# Patient Record
Sex: Female | Born: 1975 | Race: White | Hispanic: No | Marital: Single | Smoking: Former smoker
Health system: Southern US, Community
[De-identification: ages and names within clinical notes are randomized; demographics above are authoritative.]

## PROBLEM LIST (undated history)

## (undated) DIAGNOSIS — E079 Disorder of thyroid, unspecified: Secondary | ICD-10-CM

## (undated) DIAGNOSIS — F419 Anxiety disorder, unspecified: Secondary | ICD-10-CM

## (undated) DIAGNOSIS — R7303 Prediabetes: Secondary | ICD-10-CM

## (undated) DIAGNOSIS — F32A Depression, unspecified: Secondary | ICD-10-CM

## (undated) DIAGNOSIS — F329 Major depressive disorder, single episode, unspecified: Secondary | ICD-10-CM

## (undated) DIAGNOSIS — I10 Essential (primary) hypertension: Secondary | ICD-10-CM

---
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
## 2008-03-01 ENCOUNTER — Ambulatory Visit: Payer: Self-pay | Admitting: Family Medicine

## 2008-09-22 ENCOUNTER — Ambulatory Visit: Payer: Self-pay | Admitting: Internal Medicine

## 2008-11-20 ENCOUNTER — Ambulatory Visit: Payer: Self-pay | Admitting: Internal Medicine

## 2012-11-14 ENCOUNTER — Ambulatory Visit: Payer: Self-pay | Admitting: Family Medicine

## 2012-11-14 LAB — URINALYSIS, COMPLETE
Bilirubin,UR: NEGATIVE
Nitrite: NEGATIVE
Ph: 6 (ref 4.5–8.0)
Protein: NEGATIVE
RBC,UR: >30 /HPF (ref 0–5)
Specific Gravity: 1.02 (ref 1.003–1.030)

## 2012-11-16 LAB — URINE CULTURE

## 2016-11-08 ENCOUNTER — Other Ambulatory Visit: Payer: Self-pay | Admitting: Family Medicine

## 2016-11-08 DIAGNOSIS — Z1231 Encounter for screening mammogram for malignant neoplasm of breast: Secondary | ICD-10-CM

## 2016-12-08 ENCOUNTER — Encounter: Payer: Self-pay | Admitting: Radiology

## 2016-12-08 ENCOUNTER — Ambulatory Visit
Admission: RE | Admit: 2016-12-08 | Discharge: 2016-12-08 | Disposition: A | Discharge status: Home / Self Care | Payer: BLUE CROSS/BLUE SHIELD | Source: Ambulatory Visit | Attending: Family Medicine | Admitting: Family Medicine

## 2016-12-08 DIAGNOSIS — Z1231 Encounter for screening mammogram for malignant neoplasm of breast: Secondary | ICD-10-CM | POA: Diagnosis present

## 2016-12-15 ENCOUNTER — Other Ambulatory Visit: Payer: Self-pay | Admitting: Family Medicine

## 2016-12-15 DIAGNOSIS — N6310 Unspecified lump in the right breast, unspecified quadrant: Secondary | ICD-10-CM

## 2016-12-15 DIAGNOSIS — N6320 Unspecified lump in the left breast, unspecified quadrant: Secondary | ICD-10-CM

## 2016-12-15 DIAGNOSIS — R928 Other abnormal and inconclusive findings on diagnostic imaging of breast: Secondary | ICD-10-CM

## 2016-12-21 ENCOUNTER — Ambulatory Visit
Admission: RE | Admit: 2016-12-21 | Discharge: 2016-12-21 | Disposition: A | Discharge status: Home / Self Care | Payer: BLUE CROSS/BLUE SHIELD | Source: Ambulatory Visit | Attending: Family Medicine | Admitting: Family Medicine

## 2016-12-21 DIAGNOSIS — N6321 Unspecified lump in the left breast, upper outer quadrant: Secondary | ICD-10-CM | POA: Insufficient documentation

## 2016-12-21 DIAGNOSIS — R928 Other abnormal and inconclusive findings on diagnostic imaging of breast: Secondary | ICD-10-CM

## 2016-12-21 DIAGNOSIS — R590 Localized enlarged lymph nodes: Secondary | ICD-10-CM | POA: Insufficient documentation

## 2016-12-21 DIAGNOSIS — N63 Unspecified lump in unspecified breast: Secondary | ICD-10-CM | POA: Diagnosis present

## 2016-12-21 DIAGNOSIS — N6320 Unspecified lump in the left breast, unspecified quadrant: Secondary | ICD-10-CM

## 2016-12-21 DIAGNOSIS — N6313 Unspecified lump in the right breast, lower outer quadrant: Secondary | ICD-10-CM | POA: Diagnosis not present

## 2016-12-21 DIAGNOSIS — N6310 Unspecified lump in the right breast, unspecified quadrant: Secondary | ICD-10-CM

## 2018-03-05 ENCOUNTER — Ambulatory Visit (INDEPENDENT_AMBULATORY_CARE_PROVIDER_SITE_OTHER): Payer: BLUE CROSS/BLUE SHIELD

## 2018-03-05 ENCOUNTER — Ambulatory Visit
Admission: EM | Admit: 2018-03-05 | Discharge: 2018-03-05 | Disposition: A | Discharge status: Home / Self Care | Payer: BLUE CROSS/BLUE SHIELD | Attending: Family Medicine | Admitting: Family Medicine

## 2018-03-05 ENCOUNTER — Other Ambulatory Visit: Payer: Self-pay

## 2018-03-05 ENCOUNTER — Encounter: Payer: Self-pay | Admitting: Emergency Medicine

## 2018-03-05 DIAGNOSIS — S46819A Strain of other muscles, fascia and tendons at shoulder and upper arm level, unspecified arm, initial encounter: Secondary | ICD-10-CM

## 2018-03-05 DIAGNOSIS — M25512 Pain in left shoulder: Secondary | ICD-10-CM

## 2018-03-05 DIAGNOSIS — S39012A Strain of muscle, fascia and tendon of lower back, initial encounter: Secondary | ICD-10-CM

## 2018-03-05 HISTORY — DX: Essential (primary) hypertension: I10

## 2018-03-05 HISTORY — DX: Disorder of thyroid, unspecified: E07.9

## 2018-03-05 MED ORDER — METHOCARBAMOL 750 MG PO TABS: 750.0000 mg | ORAL_TABLET | Freq: Three times a day (TID) | ORAL | 0 refills | Status: DC

## 2018-03-05 MED ORDER — KETOROLAC TROMETHAMINE 60 MG/2ML IM SOLN
60.0000 mg | Freq: Once | INTRAMUSCULAR | Status: AC
  Administered 2018-03-05: 60 mg via INTRAMUSCULAR

## 2018-03-05 MED ORDER — MELOXICAM 15 MG PO TABS: 15.0000 mg | ORAL_TABLET | Freq: Every day | ORAL | 0 refills | Status: DC | PRN
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
## 2018-03-05 NOTE — Discharge Instructions (Signed)
Take medication as prescribed. Rest. Drink plenty of fluids. Stretch.  ° °Follow up with your primary care physician this week as needed. Return to Urgent care for new or worsening concerns.  ° °
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
## 2019-06-08 ENCOUNTER — Ambulatory Visit
Admission: EM | Admit: 2019-06-08 | Discharge: 2019-06-08 | Disposition: A | Discharge status: Home / Self Care | Payer: BC Managed Care – PPO | Attending: Family Medicine | Admitting: Family Medicine

## 2019-06-08 DIAGNOSIS — T782XXA Anaphylactic shock, unspecified, initial encounter: Secondary | ICD-10-CM

## 2019-06-08 DIAGNOSIS — W57XXXA Bitten or stung by nonvenomous insect and other nonvenomous arthropods, initial encounter: Secondary | ICD-10-CM

## 2019-06-08 DIAGNOSIS — R21 Rash and other nonspecific skin eruption: Secondary | ICD-10-CM

## 2019-06-08 HISTORY — DX: Depression, unspecified: F32.A

## 2019-06-08 HISTORY — DX: Anxiety disorder, unspecified: F41.9

## 2019-06-08 MED ORDER — DIPHENHYDRAMINE HCL 50 MG PO CAPS
50.0000 mg | ORAL_CAPSULE | Freq: Once | ORAL | Status: AC
Start: 2019-06-08 — End: 2019-06-08
  Administered 2019-06-08: 50 mg via ORAL

## 2019-06-08 MED ORDER — EPINEPHRINE 0.3 MG/0.3ML IJ SOAJ: 0.3000 mg | INTRAMUSCULAR | 1 refills | Status: AC | PRN

## 2019-06-08 MED ORDER — EPINEPHRINE PF 1 MG/ML IJ SOLN
0.3000 mg | Freq: Once | INTRAMUSCULAR | Status: AC
  Administered 2019-06-08: 16:00:00 0.3 mg via SUBCUTANEOUS

## 2019-06-08 MED ORDER — PREDNISONE 10 MG PO TABS: ORAL_TABLET | ORAL | 0 refills | Status: DC

## 2019-06-08 MED ORDER — METHYLPREDNISOLONE SODIUM SUCC 125 MG IJ SOLR
125.0000 mg | Freq: Once | INTRAMUSCULAR | Status: AC
  Administered 2019-06-08: 16:00:00 125 mg via INTRAMUSCULAR

## 2019-06-08 MED ORDER — FAMOTIDINE 20 MG PO TABS
20.0000 mg | ORAL_TABLET | Freq: Once | ORAL | Status: AC
  Administered 2019-06-08: 16:00:00 20 mg via ORAL

## 2019-06-08 NOTE — ED Provider Notes (Signed)
[facility]    CSN: [id number] Arrival date & time: 06/08/19  1511     History   Chief Complaint Chief Complaint  Patient presents with  . Allergic Reaction    HPI Wendy Sherman is a 43 y.o. female.   43 yo female with a c/o rash/hives, itching and lip swelling since about 2 hours ago after getting stung by a wasp on her right knee.  States she did not have a prior h/o bee allergy but that their is a family history.  Denies any chest pain, shortness of breath, wheezing, tongue or throat swelling.    Allergic Reaction   Past Medical History:  Diagnosis Date  . Anxiety   . Depression   . Hypertension   . Thyroid disease     There are no active problems to display for this patient.   History reviewed. No pertinent surgical history.  OB History   No obstetric history on file.      Home Medications    Prior to Admission medications   Medication Sig Start Date End Date Taking? Authorizing Provider  levothyroxine (SYNTHROID) 100 MCG tablet Take by mouth. 03/11/19  Yes [provider]  lisinopril (ZESTRIL) 10 MG tablet Take by mouth. 03/11/19  Yes [provider]  Norgestimate-Ethinyl Estradiol Triphasic 0.18/0.215/0.25 MG-35 MCG tablet Take by mouth. 03/19/19  Yes [provider]  phentermine (ADIPEX-P) 37.5 MG tablet TAKE 1 TABLET BY MOUTH IN THE MORNING BEFORE BREAKFAST FOR 30 DAYS 05/31/19  Yes [provider]  sertraline (ZOLOFT) 25 MG tablet Take 25 mg by mouth daily. 04/21/19  Yes [provider]  citalopram (CELEXA) 10 MG tablet Take by mouth. 09/25/17   [provider]  EPINEPHrine 0.3 mg/0.3 mL IJ SOAJ injection Inject 0.3 mLs (0.3 mg total) into the muscle as needed for anaphylaxis. 06/08/19   Payton Mccallumonty, Wendolyn Raso, MD  levothyroxine (SYNTHROID, LEVOTHROID) 100 MCG tablet Take by mouth. 03/21/17 03/21/18  [provider]  lisinopril (PRINIVIL,ZESTRIL) 10 MG tablet Take by mouth. 02/26/18 02/26/19   [provider]  meloxicam (MOBIC) 15 MG tablet Take 1 tablet (15 mg total) by mouth daily as needed. 03/05/18   Renford DillsMiller, Lindsey, NP  methocarbamol (ROBAXIN-750) 750 MG tablet Take 1-2 tablets (750-1,500 mg total) by mouth 3 (three) times daily. Do not drive or operate machinery as can cause drowsiness. 03/05/18   Renford DillsMiller, Lindsey, NP  Norgestimate-Ethinyl Estradiol Triphasic 0.18/0.215/0.25 MG-35 MCG tablet Take by mouth. 01/31/18   [provider]  phentermine 15 MG capsule Take by mouth. 02/26/18 03/28/18  [provider]  predniSONE (DELTASONE) 10 MG tablet Start 60 mg po day one, then 50 mg po day two, taper by 10 mg daily until complete. 06/08/19   Payton Mccallumonty, Primitivo Merkey, MD  topiramate (TOPAMAX) 100 MG tablet Take by mouth. 02/26/18 02/26/19  [provider]    Family History Family History  Problem Relation Age of Onset  . Breast cancer Paternal Grandmother 2260  . Hypertension Father     Social History Social History   Tobacco Use  . Smoking status: Former Games developermoker  . Smokeless tobacco: Never Used  Substance Use Topics  . Alcohol use: Yes  . Drug use: Not on file     Allergies   Patient has no known allergies.   Review of Systems Review of Systems   Physical Exam Triage Vital Signs ED Triage Vitals  Enc Vitals Group     BP 06/08/19 1521 117/81     Pulse Rate  06/08/19 1521 (!) 102     Resp 06/08/19 1521 18     Temp 06/08/19 1521 98 F (36.7 C)     Temp Source 06/08/19 1521 Oral     SpO2 06/08/19 1521 100 %     Weight 06/08/19 1519 210 lb (95.3 kg)     Height 06/08/19 1519 5\' 7"  (1.702 m)     Head Circumference --      Peak Flow --      Pain Score 06/08/19 1519 0     Pain Loc --      Pain Edu? --      Excl. in GC? --    No data found.  Updated Vital Signs BP 117/81 (BP Location: Left Arm)   Pulse (!) 102   Temp 98 F (36.7 C) (Oral)   Resp 18   Ht 5\' 7"  (1.702 m)   Wt 95.3 kg   LMP 05/29/2019 (Exact Date)   SpO2 100%   BMI 32.89  kg/m   Visual Acuity Right Eye Distance:   Left Eye Distance:   Bilateral Distance:    Right Eye Near:   Left Eye Near:    Bilateral Near:     Physical Exam Vitals signs and nursing note reviewed.  Constitutional:      General: She is not in acute distress.    Appearance: She is not toxic-appearing or diaphoretic.  HENT:     Nose: No congestion or rhinorrhea.     Mouth/Throat:     Pharynx: No pharyngeal swelling, oropharyngeal exudate, posterior oropharyngeal erythema or uvula swelling.     Comments: Lip swelling noted upper and lower Cardiovascular:     Rate and Rhythm: Regular rhythm. Tachycardia present.     Heart sounds: Normal heart sounds.  Pulmonary:     Effort: Pulmonary effort is normal. No respiratory distress.     Breath sounds: Normal breath sounds. No stridor. No wheezing, rhonchi or rales.  Skin:    Findings: Rash present. Rash is urticarial (diffuse on face, trunk, arms).  Neurological:     Mental Status: She is alert.      UC Treatments / Results  Labs (all labs ordered are listed, but only abnormal results are displayed) Labs Reviewed - No data to display  EKG   Radiology No results found.  Procedures Procedures (including critical care time)  Medications Ordered in UC Medications  diphenhydrAMINE (BENADRYL) capsule 50 mg (50 mg Oral Given 06/08/19 1535)  methylPREDNISolone sodium succinate (SOLU-MEDROL) 125 mg/2 mL injection 125 mg (125 mg Intramuscular Given 06/08/19 1542)  famotidine (PEPCID) tablet 20 mg (20 mg Oral Given 06/08/19 1556)  EPINEPHrine (ADRENALIN) 0.3 mg (0.3 mg Subcutaneous Given 06/08/19 1557)    Initial Impression / Assessment and Plan / UC Course  I have reviewed the triage vital signs and the nursing notes.  Pertinent labs & imaging results that were available during my care of the patient were reviewed by me and considered in my medical decision making (see chart for details).      Final Clinical Impressions(s) /  UC Diagnoses   Final diagnoses:  Anaphylaxis, initial encounter  (resolved)   Discharge Instructions     Benadryl as needed Go to Emergency Department if symptoms return    ED Prescriptions    Medication Sig Dispense Auth. Provider   predniSONE (DELTASONE) 10 MG tablet Start 60 mg po day one, then 50 mg po day two, taper by 10 mg daily until complete. 21 tablet Simrin Vegh,  Gordy Goar, MD   EPINEPHrine 0.3 mg/0.3 mL IJ SOAJ injection Inject 0.3 mLs (0.3 mg total) into the muscle as needed for anaphylaxis. 1 each Norval Gable, MD      1. diagnosis reviewed with patient 2. Patient given epinephrine 0.3mg  SQ, benadryl 50mg  po x1 and pepcid 20mg  po x 1; patient tolerated well and was monitored with resolution of symptoms 2. rx as per orders above; reviewed possible side effects, interactions, risks and benefits  3. Recommend supportive treatment as above 4. Follow-up prn if symptoms worsen or don't improve   Controlled Substance Prescriptions [place] Controlled Substance Registry consulted? Not Applicable   Norval Gable, MD 06/08/19 [phone]

## 2019-06-08 NOTE — ED Triage Notes (Signed)
Pt was stung in her right knee by a wasp at 1pm and since then has been having hives all over her body, itching, redness and swelling and having tightness in her chest but unsure if it's anxiety. Didn't think she was allergic to wasps before.

## 2019-06-08 NOTE — Discharge Instructions (Signed)
Benadryl as needed Go to Emergency Department if symptoms return
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
## 2021-10-18 ENCOUNTER — Ambulatory Visit
Admission: EM | Admit: 2021-10-18 | Discharge: 2021-10-18 | Disposition: A | Discharge status: Home / Self Care | Payer: BC Managed Care – PPO | Attending: Emergency Medicine | Admitting: Emergency Medicine

## 2021-10-18 DIAGNOSIS — M10071 Idiopathic gout, right ankle and foot: Secondary | ICD-10-CM | POA: Diagnosis not present

## 2021-10-18 HISTORY — DX: Prediabetes: R73.03

## 2021-10-18 MED ORDER — PREDNISONE 10 MG PO TABS: ORAL_TABLET | ORAL | 0 refills | Status: AC

## 2021-10-18 MED ORDER — KETOROLAC TROMETHAMINE 60 MG/2ML IM SOLN
30.0000 mg | Freq: Once | INTRAMUSCULAR | Status: AC
  Administered 2021-10-18: 19:00:00 30 mg via INTRAMUSCULAR

## 2021-10-18 NOTE — ED Provider Notes (Signed)
[facility]    CSN: [id number] Arrival date & time: 10/18/21  1759      History   Chief Complaint Chief Complaint  Patient presents with   Gout "Flareup"    HPI Wendy Sherman is a 45 y.o. female.   Patient presents with pain and swelling of the posterior right ankle for 1 day.  Endorses that pain started abruptly upon awakening.  Painful to bear weight.  Limited range of motion due to pain.  Denies numbness, tingling, or trauma, prior injury.  Has attempted use of H which was not helpful.  History of anxiety, hypertension, prediabetes and thyroid disease.    Past Medical History:  Diagnosis Date   Anxiety    Depression    Hypertension    Pre-diabetes    Thyroid disease     There are no problems to display for this patient.   History reviewed. No pertinent surgical history.  OB History   No obstetric history on file.      Home Medications    Prior to Admission medications   Medication Sig Start Date End Date Taking? Authorizing Provider  levothyroxine (SYNTHROID) 100 MCG tablet Take by mouth. 03/11/19  Yes [provider]  lisinopril (ZESTRIL) 10 MG tablet Take by mouth. 03/11/19  Yes [provider]  Norgestimate-Ethinyl Estradiol Triphasic 0.18/0.215/0.25 MG-35 MCG tablet Take by mouth. 01/31/18  Yes [provider]  sertraline (ZOLOFT) 25 MG tablet Take 25 mg by mouth daily. 04/21/19  Yes [provider]  citalopram (CELEXA) 10 MG tablet Take by mouth. 09/25/17   [provider]  EPINEPHrine 0.3 mg/0.3 mL IJ SOAJ injection Inject 0.3 mLs (0.3 mg total) into the muscle as needed for anaphylaxis. 06/08/19   Payton Mccallum, MD  levothyroxine (SYNTHROID, LEVOTHROID) 100 MCG tablet Take by mouth. 03/21/17 03/21/18  [provider]  lisinopril (PRINIVIL,ZESTRIL) 10 MG tablet Take by mouth. 02/26/18 02/26/19  [provider]  meloxicam (MOBIC) 15 MG tablet Take 1 tablet (15 mg total) by mouth daily as  needed. 03/05/18   Renford Dills, NP  methocarbamol (ROBAXIN-750) 750 MG tablet Take 1-2 tablets (750-1,500 mg total) by mouth 3 (three) times daily. Do not drive or operate machinery as can cause drowsiness. 03/05/18   Renford Dills, NP  Norgestimate-Ethinyl Estradiol Triphasic 0.18/0.215/0.25 MG-35 MCG tablet Take by mouth. 03/19/19   [provider]  phentermine (ADIPEX-P) 37.5 MG tablet TAKE 1 TABLET BY MOUTH IN THE MORNING BEFORE BREAKFAST FOR 30 DAYS 05/31/19   [provider]  phentermine 15 MG capsule Take by mouth. 02/26/18 03/28/18  [provider]  predniSONE (DELTASONE) 10 MG tablet Start 60 mg po day one, then 50 mg po day two, taper by 10 mg daily until complete. 06/08/19   Payton Mccallum, MD  topiramate (TOPAMAX) 100 MG tablet Take by mouth. 02/26/18 02/26/19  [provider]    Family History Family History  Problem Relation Age of Onset   Breast cancer Paternal Grandmother 58   Hypertension Father     Social History Social History   Tobacco Use   Smoking status: Former   Smokeless tobacco: Never  Building services engineer Use: Never used  Substance Use Topics   Alcohol use: Yes    Comment: Occassionally.   Drug use: Not Currently     Allergies   Patient has no known allergies.   Review of Systems Review of Systems  Constitutional: Negative.   Respiratory: Negative.    Musculoskeletal:  Positive for joint swelling. Negative for arthralgias, back pain, gait problem, myalgias, neck pain and neck stiffness.  Skin: Negative.   Neurological: Negative.     Physical Exam Triage Vital Signs ED Triage Vitals  Enc Vitals Group     BP 10/18/21 1831 (!) 152/97     Pulse Rate 10/18/21 1831 69     Resp 10/18/21 1831 20     Temp 10/18/21 1831 97.8 F (36.6 C)     Temp Source 10/18/21 1831 Oral     SpO2 10/18/21 1831 100 %     Weight 10/18/21 1831 254 lb (115.2 kg)     Height --      Head Circumference --      Peak Flow --      Pain Score  10/18/21 1830 10     Pain Loc --      Pain Edu? --      Excl. in GC? --    No data found.  Updated Vital Signs BP (!) 152/97 (BP Location: Left Arm)    Pulse 69    Temp 97.8 F (36.6 C) (Oral)    Resp 20    Wt 254 lb (115.2 kg)    LMP 09/18/2021 (Approximate)    SpO2 100%    BMI 39.78 kg/m   Visual Acuity Right Eye Distance:   Left Eye Distance:   Bilateral Distance:    Right Eye Near:   Left Eye Near:    Bilateral Near:     Physical Exam Constitutional:      Appearance: Normal appearance.  HENT:     Head: Normocephalic.  Eyes:     Extraocular Movements: Extraocular movements intact.  Pulmonary:     Effort: Pulmonary effort is normal.  Musculoskeletal:     Comments: Tenderness and mild to moderate swelling over the lateral meatus of the right ankle, range of motion intact but elicits pain primarily with rotation and extension, 2+ dorsalis pedis pulse  Skin:    General: Skin is warm and dry.  Neurological:     Mental Status: She is alert and oriented to person, place, and time. Mental status is at baseline.  Psychiatric:        Mood and Affect: Mood normal.        Behavior: Behavior normal.     UC Treatments / Results  Labs (all labs ordered are listed, but only abnormal results are displayed) Labs Reviewed - No data to display  EKG   Radiology No results found.  Procedures Procedures (including critical care time)  Medications Ordered in UC Medications - No data to display  Initial Impression / Assessment and Plan / UC Course  I have reviewed the triage vital signs and the nursing notes.  Pertinent labs & imaging results that were available during my care of the patient were reviewed by me and considered in my medical decision making (see chart for details).  Acute idiopathic gout of right ankle  Will defer imaging at this time due to lack of injury, discussed with patient, in agreement with plan of care, patient has done well with prednisone course  during previous flareups, will prescribe today, Toradol injection given in office, recommended RICE, may use heat for comfort, activity as tolerated, work note given, for podiatry follow-up for persistent or reoccurring pain Final Clinical Impressions(s) / UC Diagnoses   Final diagnoses:  None   Discharge Instructions   None    ED Prescriptions   None  PDMP not reviewed this encounter.   Valinda Hoar, NP 10/18/21 1902

## 2021-10-18 NOTE — ED Triage Notes (Signed)
Patient is here for "Gout pain". Right foot. Started "yesterday morning'. No injury known.

## 2021-10-18 NOTE — Discharge Instructions (Signed)
Today you are being treated for a gout flare  Beginning tomorrow morning take prednisone with food as notated on package  In addition you may attempt the following below 1.May apply heat or ice in 15-minute intervals for additional comfort 2.Raise (elevate) the painful joint above the level of your heart as often as you can. 3. Rest the joint as much as possible 4. Avoid foods and drinks such as: Liver. Kidney. Anchovies. Asparagus. Herring. Mushrooms. Mussels. Beer. If symptoms continue to persist or worsen please follow-up with your podiatrist for reevaluation

## 2022-05-19 ENCOUNTER — Other Ambulatory Visit: Payer: Self-pay

## 2022-05-19 ENCOUNTER — Encounter: Payer: Self-pay | Admitting: Emergency Medicine

## 2022-05-19 ENCOUNTER — Emergency Department
Admission: EM | Admit: 2022-05-19 | Discharge: 2022-05-19 | Disposition: A | Discharge status: Home / Self Care | Payer: BC Managed Care – PPO | Attending: Emergency Medicine | Admitting: Emergency Medicine

## 2022-05-19 DIAGNOSIS — M546 Pain in thoracic spine: Secondary | ICD-10-CM | POA: Diagnosis present

## 2022-05-19 DIAGNOSIS — N390 Urinary tract infection, site not specified: Secondary | ICD-10-CM | POA: Diagnosis not present

## 2022-05-19 DIAGNOSIS — R110 Nausea: Secondary | ICD-10-CM | POA: Insufficient documentation

## 2022-05-19 LAB — POC URINE PREG, ED: Preg Test, Ur: NEGATIVE

## 2022-05-19 LAB — URINALYSIS, ROUTINE W REFLEX MICROSCOPIC
Bilirubin Urine: NEGATIVE
Glucose, UA: NEGATIVE mg/dL
Ketones, ur: NEGATIVE mg/dL
Nitrite: NEGATIVE
Protein, ur: NEGATIVE mg/dL
Specific Gravity, Urine: 1.008 (ref 1.005–1.030)
pH: 7 (ref 5.0–8.0)

## 2022-05-19 MED ORDER — ONDANSETRON 4 MG PO TBDP: 4.0000 mg | ORAL_TABLET | Freq: Three times a day (TID) | ORAL | 0 refills | Status: AC | PRN

## 2022-05-19 MED ORDER — CEFDINIR 300 MG PO CAPS: 300.0000 mg | ORAL_CAPSULE | Freq: Two times a day (BID) | ORAL | 0 refills | Status: AC

## 2022-05-19 MED ORDER — HYDROCODONE-ACETAMINOPHEN 5-325 MG PO TABS
1.0000 | ORAL_TABLET | ORAL | 0 refills | Status: AC | PRN
Start: 2022-05-19 — End: 2023-05-19

## 2022-05-19 NOTE — ED Triage Notes (Signed)
Pt reports upper back pain that started Monday of this week. Pt reports she is unsure of anything that makes the pain worse. Pt reports nausea.

## 2022-05-19 NOTE — Discharge Instructions (Addendum)
Follow-up with your primary care provider if any continued problems or concerns.  Increase fluids to stay hydrated.  3 prescriptions were sent to your pharmacy.  1 is an antibiotic to take 10 days, Zofran if needed for nausea and hydrocodone if needed for back pain.  Follow-up with your primary care provider if any continued problems or concerns.

## 2022-05-19 NOTE — ED Provider Notes (Signed)
[facility] Provider Note    Event Date/Time   First MD Initiated Contact with Patient 05/19/22 1110     (approximate)   History   Back Pain   HPI  Wendy Sherman is a 46 y.o. female   presents to the ED with complaint of upper to mid back pain that started the first of the week.  Patient denies any known injury to her back.  She also reports some nausea.  Patient has a history of kidney stones.  She denies any urinary symptoms.      Physical Exam   Triage Vital Signs: ED Triage Vitals  Enc Vitals Group     BP 05/19/22 1035 (!) 165/101     Pulse Rate 05/19/22 1035 87     Resp 05/19/22 1035 17     Temp 05/19/22 1035 97.8 F (36.6 C)     Temp Source 05/19/22 1035 Oral     SpO2 05/19/22 1035 100 %     Weight 05/19/22 1036 243 lb (110.2 kg)     Height 05/19/22 1036 5\' 7"  (1.702 m)     Head Circumference --      Peak Flow --      Pain Score 05/19/22 1038 8     Pain Loc --      Pain Edu? --      Excl. in GC? --     Most recent vital signs: Vitals:   05/19/22 1037 05/19/22 1249  BP: (!) 150/94 (!) 148/88  Pulse:  80  Resp:  16  Temp:    SpO2:  99%     General: Awake, no distress.  CV:  Good peripheral perfusion.  Resp:  Normal effort.  Lungs are clear bilaterally. Abd:  No distention.  Soft, nontender, bowel sounds normoactive x4 quadrants. Other:  No point tenderness on palpation of the thoracic or lumbar spine.  No step-offs are appreciated.  No paravertebral muscle tenderness.  No restriction of range of motion.  Patient is ambulatory without any assistance.   ED Results / Procedures / Treatments   Labs (all labs ordered are listed, but only abnormal results are displayed) Labs Reviewed  URINALYSIS, ROUTINE W REFLEX MICROSCOPIC - Abnormal; Notable for the following components:      Result Value   Color, Urine YELLOW (*)    APPearance HAZY (*)    Hgb urine dipstick SMALL (*)    Leukocytes,Ua SMALL (*)    Bacteria, UA MANY (*)     All other components within normal limits  POC URINE PREG, ED      PROCEDURES:  Critical Care performed:   Procedures   MEDICATIONS ORDERED IN ED: Medications - No data to display   IMPRESSION / MDM / ASSESSMENT AND PLAN / ED COURSE  I reviewed the triage vital signs and the nursing notes.   Differential diagnosis includes, but is not limited to, muscle skeletal pain, lumbar pain, lumbar strain, urinary tract infection, urolithiasis.  46 year old female presents to the ED with complaint of upper back pain that started 4 days ago without history of injury.  Patient states she has also had some nausea but denies any known urinary symptoms, fever or chills.  No reported back injury.  Patient does have a history of urolithiasis but has been many years since she has had a stone.  Urinalysis was consistent with a urinary tract infection.  Patient was made aware.  Prescription for Omnicef and Zofran was sent to the pharmacy  along with hydrocodone as needed for pain.  She is encouraged to increase fluids and follow-up with her PCP for recheck of her urine.      Patient's presentation is most consistent with acute complicated illness / injury requiring diagnostic workup.  FINAL CLINICAL IMPRESSION(S) / ED DIAGNOSES   Final diagnoses:  Acute urinary tract infection     Rx / DC Orders   ED Discharge Orders          Ordered    cefdinir (OMNICEF) 300 MG capsule  2 times daily        05/19/22 1239    ondansetron (ZOFRAN-ODT) 4 MG disintegrating tablet  Every 8 hours PRN        05/19/22 1239    HYDROcodone-acetaminophen (NORCO/VICODIN) 5-325 MG tablet  Every 4 hours PRN        05/19/22 1239             Note:  This document was prepared using Dragon voice recognition software and may include unintentional dictation errors.   Tommi Rumps, PA-C 05/19/22 1337    Minna Antis, MD 05/19/22 1911

## 2022-08-10 ENCOUNTER — Other Ambulatory Visit: Payer: Self-pay | Admitting: Family Medicine

## 2022-08-10 DIAGNOSIS — Z1231 Encounter for screening mammogram for malignant neoplasm of breast: Secondary | ICD-10-CM

## 2022-08-31 ENCOUNTER — Ambulatory Visit
Admission: RE | Admit: 2022-08-31 | Discharge: 2022-08-31 | Disposition: A | Discharge status: Home / Self Care | Payer: BC Managed Care – PPO | Source: Ambulatory Visit | Attending: Family Medicine | Admitting: Family Medicine

## 2022-08-31 DIAGNOSIS — Z1231 Encounter for screening mammogram for malignant neoplasm of breast: Secondary | ICD-10-CM | POA: Diagnosis present
